# Patient Record
Sex: Female | Born: 1935 | Race: White | Hispanic: No | Marital: Single | Smoking: Former smoker
Health system: Southern US, Community
[De-identification: ages and names within clinical notes are randomized; demographics above are authoritative.]

## PROBLEM LIST (undated history)

## (undated) DIAGNOSIS — C4490 Unspecified malignant neoplasm of skin, unspecified: Secondary | ICD-10-CM

## (undated) DIAGNOSIS — C50919 Malignant neoplasm of unspecified site of unspecified female breast: Secondary | ICD-10-CM

---

## 2002-08-02 HISTORY — PX: BREAST BIOPSY: SHX20

## 2003-06-03 ENCOUNTER — Other Ambulatory Visit: Payer: Self-pay

## 2004-06-29 ENCOUNTER — Ambulatory Visit: Payer: Self-pay | Admitting: General Surgery

## 2005-07-12 ENCOUNTER — Ambulatory Visit: Payer: Self-pay | Admitting: Internal Medicine

## 2006-05-14 ENCOUNTER — Ambulatory Visit: Payer: Self-pay

## 2006-07-13 ENCOUNTER — Ambulatory Visit: Payer: Self-pay | Admitting: Internal Medicine

## 2007-02-01 ENCOUNTER — Ambulatory Visit: Payer: Self-pay | Admitting: Gastroenterology

## 2007-05-24 ENCOUNTER — Ambulatory Visit: Payer: Self-pay | Admitting: Gastroenterology

## 2007-08-03 DIAGNOSIS — C50919 Malignant neoplasm of unspecified site of unspecified female breast: Secondary | ICD-10-CM

## 2007-08-03 HISTORY — PX: BREAST BIOPSY: SHX20

## 2007-08-03 HISTORY — DX: Malignant neoplasm of unspecified site of unspecified female breast: C50.919

## 2007-08-08 ENCOUNTER — Ambulatory Visit: Payer: Self-pay | Admitting: Internal Medicine

## 2007-08-09 ENCOUNTER — Ambulatory Visit: Payer: Self-pay | Admitting: Internal Medicine

## 2007-08-31 ENCOUNTER — Ambulatory Visit: Payer: Self-pay | Admitting: General Surgery

## 2007-09-03 ENCOUNTER — Ambulatory Visit: Payer: Self-pay | Admitting: Internal Medicine

## 2007-09-07 ENCOUNTER — Ambulatory Visit: Payer: Self-pay | Admitting: General Surgery

## 2007-09-07 ENCOUNTER — Other Ambulatory Visit: Payer: Self-pay

## 2007-09-12 ENCOUNTER — Ambulatory Visit: Payer: Self-pay | Admitting: General Surgery

## 2007-09-28 ENCOUNTER — Ambulatory Visit: Payer: Self-pay | Admitting: Internal Medicine

## 2007-10-01 ENCOUNTER — Ambulatory Visit: Payer: Self-pay | Admitting: Internal Medicine

## 2007-10-31 ENCOUNTER — Ambulatory Visit: Payer: Self-pay | Admitting: Radiation Oncology

## 2007-11-01 ENCOUNTER — Ambulatory Visit: Payer: Self-pay | Admitting: Radiation Oncology

## 2007-12-01 ENCOUNTER — Ambulatory Visit: Payer: Self-pay | Admitting: Radiation Oncology

## 2008-01-01 ENCOUNTER — Ambulatory Visit: Payer: Self-pay | Admitting: Radiation Oncology

## 2008-01-31 ENCOUNTER — Ambulatory Visit: Payer: Self-pay | Admitting: Radiation Oncology

## 2008-02-15 ENCOUNTER — Ambulatory Visit: Payer: Self-pay | Admitting: Unknown Physician Specialty

## 2008-02-21 ENCOUNTER — Ambulatory Visit: Payer: Self-pay | Admitting: Unknown Physician Specialty

## 2008-03-02 ENCOUNTER — Ambulatory Visit: Payer: Self-pay | Admitting: Radiation Oncology

## 2008-03-11 ENCOUNTER — Ambulatory Visit: Payer: Self-pay | Admitting: General Surgery

## 2008-05-02 ENCOUNTER — Ambulatory Visit: Payer: Self-pay | Admitting: Internal Medicine

## 2008-05-21 ENCOUNTER — Ambulatory Visit: Payer: Self-pay | Admitting: Internal Medicine

## 2008-06-02 ENCOUNTER — Ambulatory Visit: Payer: Self-pay | Admitting: Internal Medicine

## 2008-07-02 ENCOUNTER — Ambulatory Visit: Payer: Self-pay | Admitting: Internal Medicine

## 2008-07-04 ENCOUNTER — Ambulatory Visit: Payer: Self-pay | Admitting: Internal Medicine

## 2008-08-02 ENCOUNTER — Ambulatory Visit: Payer: Self-pay | Admitting: Internal Medicine

## 2008-08-21 ENCOUNTER — Ambulatory Visit: Payer: Self-pay | Admitting: Internal Medicine

## 2008-09-02 ENCOUNTER — Ambulatory Visit: Payer: Self-pay | Admitting: Internal Medicine

## 2008-09-18 ENCOUNTER — Ambulatory Visit: Payer: Self-pay | Admitting: Internal Medicine

## 2008-09-30 ENCOUNTER — Ambulatory Visit: Payer: Self-pay | Admitting: Internal Medicine

## 2008-12-31 ENCOUNTER — Ambulatory Visit: Payer: Self-pay | Admitting: Radiation Oncology

## 2009-01-06 ENCOUNTER — Ambulatory Visit: Payer: Self-pay | Admitting: Internal Medicine

## 2009-01-30 ENCOUNTER — Ambulatory Visit: Payer: Self-pay | Admitting: Radiation Oncology

## 2009-02-18 ENCOUNTER — Ambulatory Visit: Payer: Self-pay | Admitting: Internal Medicine

## 2009-03-02 ENCOUNTER — Ambulatory Visit: Payer: Self-pay | Admitting: Internal Medicine

## 2009-03-02 ENCOUNTER — Ambulatory Visit: Payer: Self-pay | Admitting: Radiation Oncology

## 2009-04-02 ENCOUNTER — Ambulatory Visit: Payer: Self-pay | Admitting: Internal Medicine

## 2009-06-02 ENCOUNTER — Ambulatory Visit: Payer: Self-pay | Admitting: Internal Medicine

## 2009-06-20 ENCOUNTER — Ambulatory Visit: Payer: Self-pay | Admitting: Internal Medicine

## 2009-07-02 ENCOUNTER — Ambulatory Visit: Payer: Self-pay | Admitting: Internal Medicine

## 2009-08-04 ENCOUNTER — Ambulatory Visit: Payer: Self-pay | Admitting: Gastroenterology

## 2009-09-10 ENCOUNTER — Ambulatory Visit: Payer: Self-pay | Admitting: Internal Medicine

## 2009-09-30 ENCOUNTER — Ambulatory Visit: Payer: Self-pay | Admitting: Internal Medicine

## 2009-10-17 ENCOUNTER — Ambulatory Visit: Payer: Self-pay | Admitting: Internal Medicine

## 2009-10-31 ENCOUNTER — Ambulatory Visit: Payer: Self-pay | Admitting: Internal Medicine

## 2009-12-31 ENCOUNTER — Ambulatory Visit: Payer: Self-pay | Admitting: Internal Medicine

## 2010-01-15 ENCOUNTER — Ambulatory Visit: Payer: Self-pay | Admitting: Internal Medicine

## 2010-01-30 ENCOUNTER — Ambulatory Visit: Payer: Self-pay | Admitting: Internal Medicine

## 2010-02-17 ENCOUNTER — Ambulatory Visit: Payer: Self-pay | Admitting: Internal Medicine

## 2010-02-19 LAB — CANCER ANTIGEN 27.29: CA 27.29: 7.2 U/mL (ref 0.0–38.6)

## 2010-03-02 ENCOUNTER — Ambulatory Visit: Payer: Self-pay | Admitting: Internal Medicine

## 2010-08-20 ENCOUNTER — Ambulatory Visit: Payer: Self-pay | Admitting: Internal Medicine

## 2010-08-21 LAB — CANCER ANTIGEN 27.29: CA 27.29: 11.6 U/mL (ref 0.0–38.6)

## 2010-09-02 ENCOUNTER — Ambulatory Visit: Payer: Self-pay | Admitting: Internal Medicine

## 2010-10-01 ENCOUNTER — Ambulatory Visit: Payer: Self-pay | Admitting: Internal Medicine

## 2011-01-18 ENCOUNTER — Ambulatory Visit: Payer: Self-pay | Admitting: Internal Medicine

## 2011-01-31 ENCOUNTER — Ambulatory Visit: Payer: Self-pay | Admitting: Internal Medicine

## 2011-08-25 ENCOUNTER — Ambulatory Visit: Payer: Self-pay | Admitting: Internal Medicine

## 2011-08-25 LAB — CBC CANCER CENTER
Basophil %: 0.4 %
Eosinophil #: 0.3 x10 3/mm (ref 0.0–0.7)
HGB: 9.2 g/dL — ABNORMAL LOW (ref 12.0–16.0)
Lymphocyte %: 16.9 %
MCHC: 32.7 g/dL (ref 32.0–36.0)
Monocyte #: 0.9 x10 3/mm — ABNORMAL HIGH (ref 0.0–0.7)
Monocyte %: 9.7 %
Neutrophil #: 6.2 x10 3/mm (ref 1.4–6.5)
Platelet: 434 x10 3/mm (ref 150–440)
RBC: 3.21 10*6/uL — ABNORMAL LOW (ref 3.80–5.20)
RDW: 14.7 % — ABNORMAL HIGH (ref 11.5–14.5)
WBC: 8.9 x10 3/mm (ref 3.6–11.0)

## 2011-08-25 LAB — IRON AND TIBC
Iron Bind.Cap.(Total): 302 ug/dL (ref 250–450)
Iron Saturation: 23 %

## 2011-08-25 LAB — COMPREHENSIVE METABOLIC PANEL
Albumin: 2.9 g/dL — ABNORMAL LOW (ref 3.4–5.0)
Alkaline Phosphatase: 47 U/L — ABNORMAL LOW (ref 50–136)
Anion Gap: 8 (ref 7–16)
BUN: 15 mg/dL (ref 7–18)
Calcium, Total: 9.2 mg/dL (ref 8.5–10.1)
Creatinine: 0.86 mg/dL (ref 0.60–1.30)
EGFR (African American): >60
EGFR (Non-African Amer.): >60
Glucose: 116 mg/dL — ABNORMAL HIGH (ref 65–99)
Osmolality: 285 (ref 275–301)
SGOT(AST): 12 U/L — ABNORMAL LOW (ref 15–37)
SGPT (ALT): 9 U/L — ABNORMAL LOW
Total Protein: 7.1 g/dL (ref 6.4–8.2)

## 2011-09-03 ENCOUNTER — Ambulatory Visit: Payer: Self-pay | Admitting: Internal Medicine

## 2011-09-29 ENCOUNTER — Ambulatory Visit: Payer: Self-pay | Admitting: Gastroenterology

## 2011-10-01 LAB — PATHOLOGY REPORT

## 2011-10-19 ENCOUNTER — Ambulatory Visit: Payer: Self-pay

## 2011-11-10 ENCOUNTER — Ambulatory Visit: Payer: Self-pay | Admitting: Internal Medicine

## 2012-01-17 ENCOUNTER — Ambulatory Visit: Payer: Self-pay | Admitting: Radiation Oncology

## 2012-01-31 ENCOUNTER — Ambulatory Visit: Payer: Self-pay | Admitting: Radiation Oncology

## 2012-02-23 LAB — COMPREHENSIVE METABOLIC PANEL
Alkaline Phosphatase: 56 U/L (ref 50–136)
Anion Gap: 9 (ref 7–16)
BUN: 18 mg/dL (ref 7–18)
Chloride: 104 mmol/L (ref 98–107)
Creatinine: 0.9 mg/dL (ref 0.60–1.30)
EGFR (African American): >60
Glucose: 97 mg/dL (ref 65–99)
Potassium: 4.2 mmol/L (ref 3.5–5.1)
SGOT(AST): 12 U/L — ABNORMAL LOW (ref 15–37)
Sodium: 141 mmol/L (ref 136–145)
Total Protein: 7.3 g/dL (ref 6.4–8.2)

## 2012-02-23 LAB — CBC CANCER CENTER
Basophil #: 0 x10 3/mm (ref 0.0–0.1)
Basophil %: 0.4 %
Eosinophil %: 2.8 %
HCT: 36.5 % (ref 35.0–47.0)
HGB: 11.9 g/dL — ABNORMAL LOW (ref 12.0–16.0)
Lymphocyte #: 1.7 x10 3/mm (ref 1.0–3.6)
Lymphocyte %: 20.8 %
MCH: 31 pg (ref 26.0–34.0)
MCHC: 32.7 g/dL (ref 32.0–36.0)
MCV: 95 fL (ref 80–100)
Monocyte %: 10.3 %
Neutrophil #: 5.4 x10 3/mm (ref 1.4–6.5)

## 2012-02-24 LAB — CANCER ANTIGEN 27.29: CA 27.29: 8.2 U/mL (ref 0.0–38.6)

## 2012-03-02 ENCOUNTER — Ambulatory Visit: Payer: Self-pay | Admitting: Oncology

## 2012-03-02 ENCOUNTER — Ambulatory Visit: Payer: Self-pay | Admitting: Radiation Oncology

## 2012-08-02 ENCOUNTER — Ambulatory Visit: Payer: Self-pay | Admitting: Oncology

## 2012-09-30 ENCOUNTER — Ambulatory Visit: Payer: Self-pay | Admitting: Oncology

## 2012-10-19 ENCOUNTER — Ambulatory Visit: Payer: Self-pay | Admitting: Oncology

## 2012-10-31 ENCOUNTER — Ambulatory Visit: Payer: Self-pay | Admitting: Oncology

## 2012-10-31 LAB — CBC CANCER CENTER
Basophil #: 0 x10 3/mm (ref 0.0–0.1)
Eosinophil #: 0.4 x10 3/mm (ref 0.0–0.7)
Eosinophil %: 5.3 %
HCT: 36.6 % (ref 35.0–47.0)
Lymphocyte #: 1.4 x10 3/mm (ref 1.0–3.6)
Lymphocyte %: 20.3 %
MCH: 31.8 pg (ref 26.0–34.0)
MCV: 93 fL (ref 80–100)
Monocyte #: 0.8 x10 3/mm (ref 0.2–0.9)
Monocyte %: 10.8 %
Neutrophil #: 4.4 x10 3/mm (ref 1.4–6.5)
Neutrophil %: 62.9 %

## 2012-10-31 LAB — COMPREHENSIVE METABOLIC PANEL
Anion Gap: 11 (ref 7–16)
BUN: 16 mg/dL (ref 7–18)
Bilirubin,Total: 0.2 mg/dL (ref 0.2–1.0)
EGFR (African American): 55 — ABNORMAL LOW
Glucose: 141 mg/dL — ABNORMAL HIGH (ref 65–99)
Osmolality: 281 (ref 275–301)
SGOT(AST): 16 U/L (ref 15–37)
Sodium: 139 mmol/L (ref 136–145)
Total Protein: 7.9 g/dL (ref 6.4–8.2)

## 2012-11-01 LAB — CANCER ANTIGEN 27.29: CA 27.29: 12.8 U/mL (ref 0.0–38.6)

## 2012-11-30 ENCOUNTER — Ambulatory Visit: Payer: Self-pay | Admitting: Oncology

## 2013-06-23 IMAGING — CT CT HEAD WITHOUT CONTRAST
2 series · 16 of 30 positions shown, 20 images · non-contrast
Comparison: none

REASON FOR EXAM: dizziness
COMMENTS:

PROCEDURE:     CT  - CT HEAD WITHOUT CONTRAST  - November 10, 2011  [DATE]
RESULT:     Comparison:  None
TECHNIQUE: Multiple axial images from the foramen magnum to the vertex were
obtained without IV contrast.

[Series 2: without · axial · non-contrast · 0.43mm/px · z∈[+486,+606]mm · 13 of 29 slices shown, 17 images]
[im 3/29  brain]
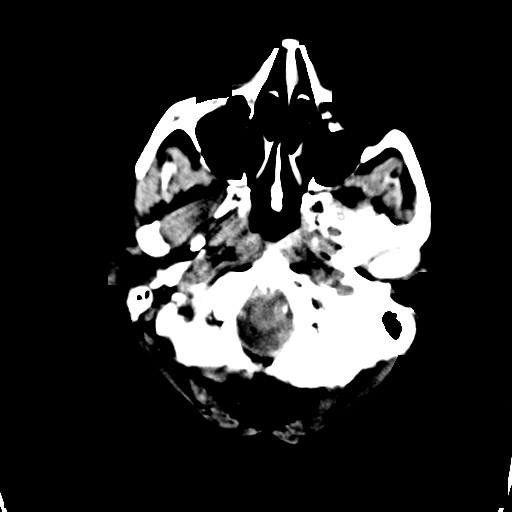
[im 3/29  bone]
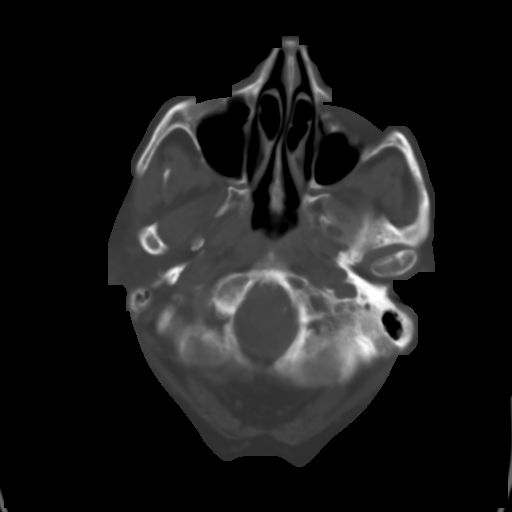
[im 5/29  brain]
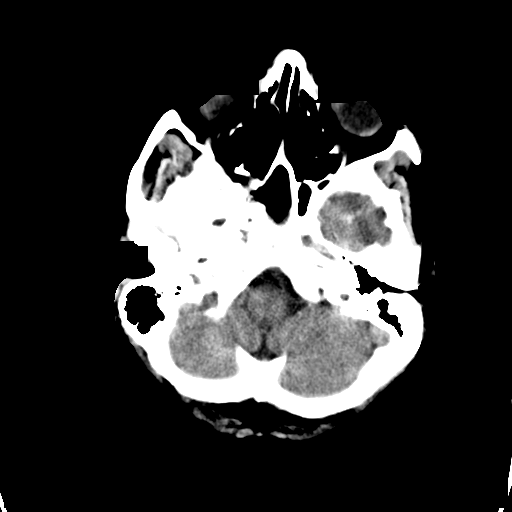
[im 7/29  brain]
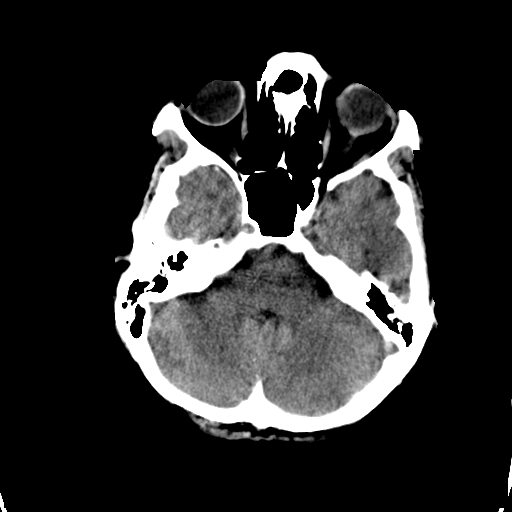
[im 9/29  brain]
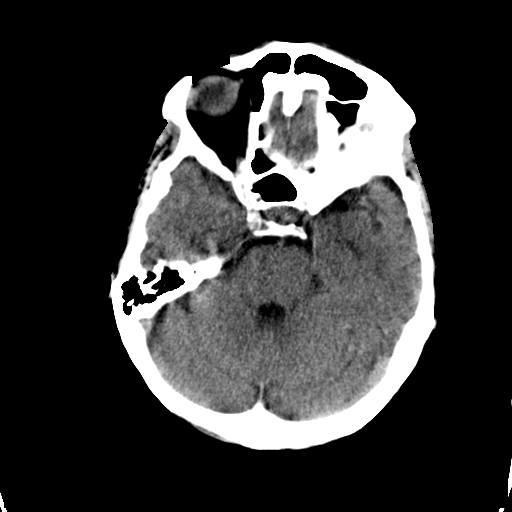
[im 11/29  brain]
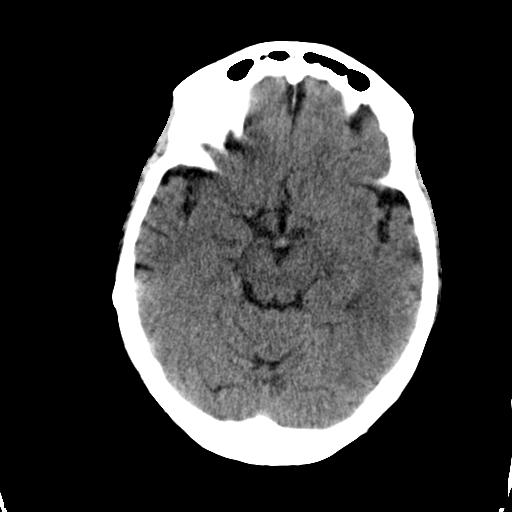
[im 11/29  bone]
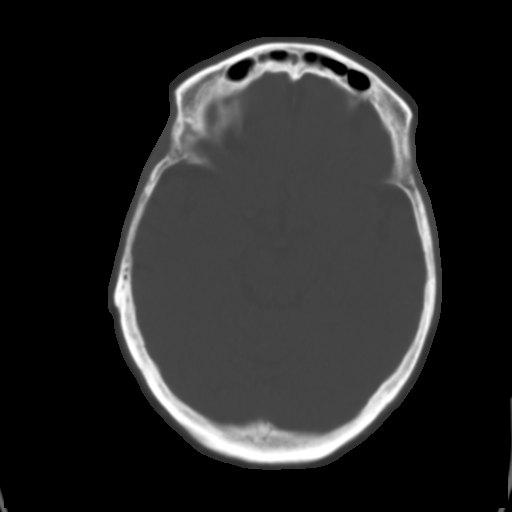
[im 13/29  brain]
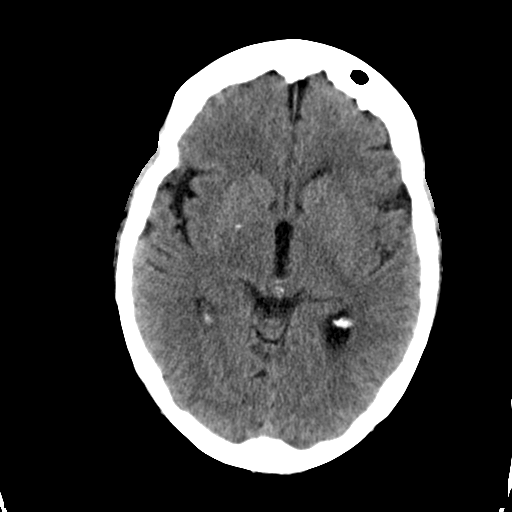
[im 15/29  brain]
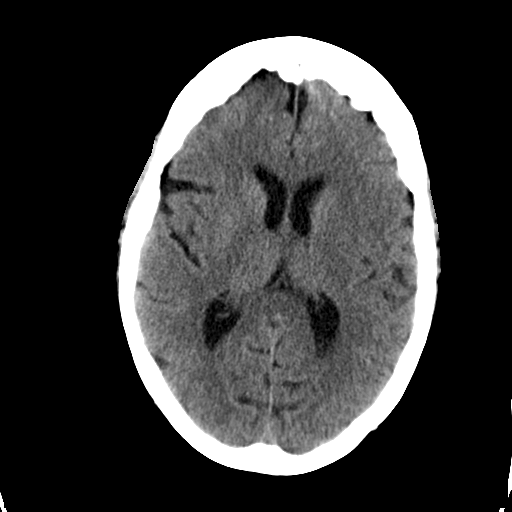
[im 17/29  brain]
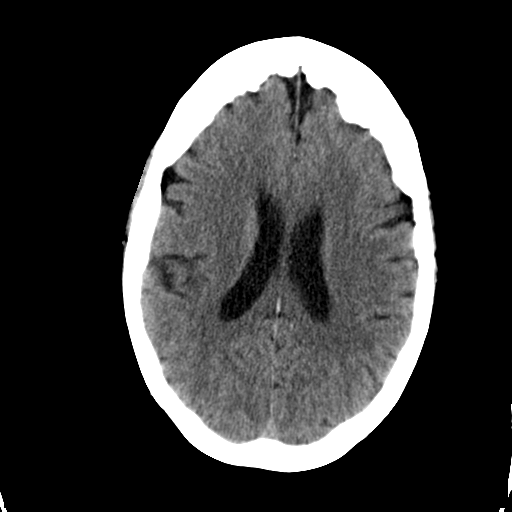
[im 19/29  brain]
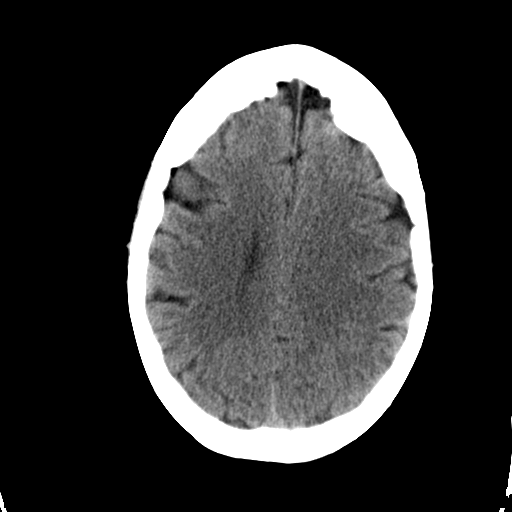
[im 19/29  bone]
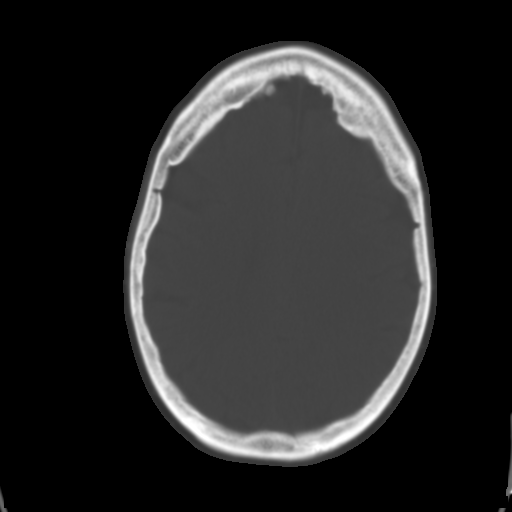
[im 21/29  brain]
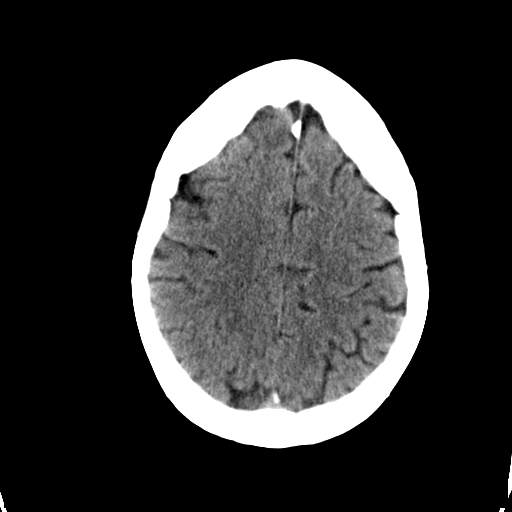
[im 23/29  brain]
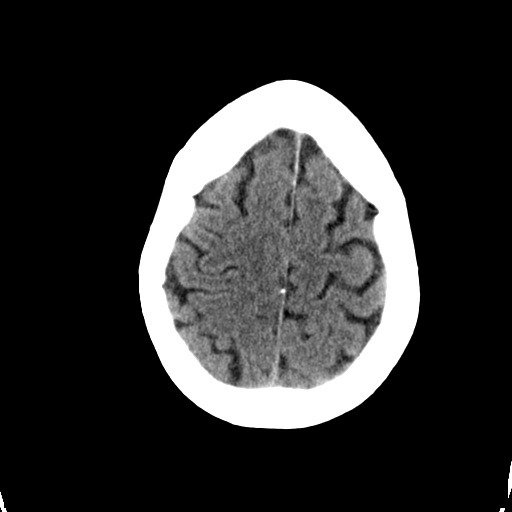
[im 25/29  brain]
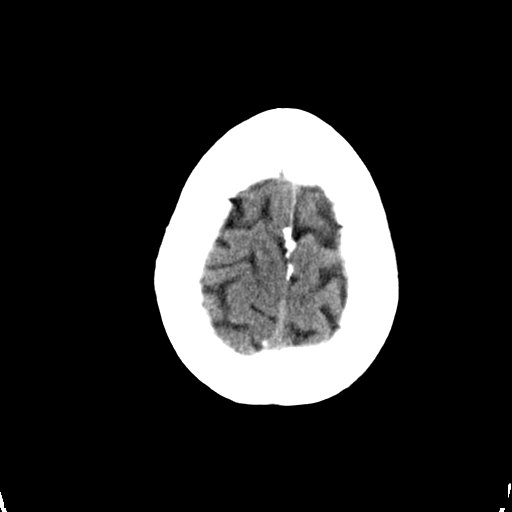
[im 27/29  brain]
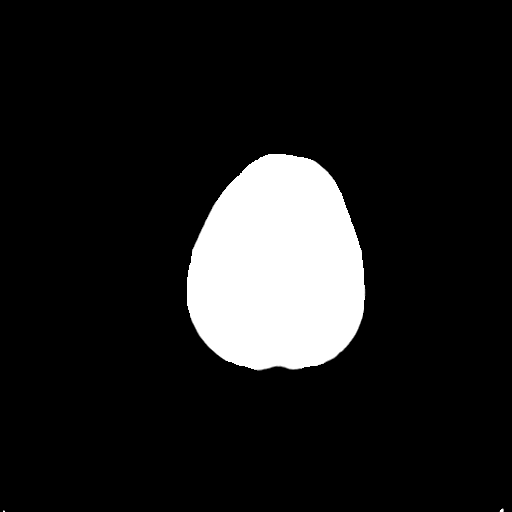
[im 27/29  bone]
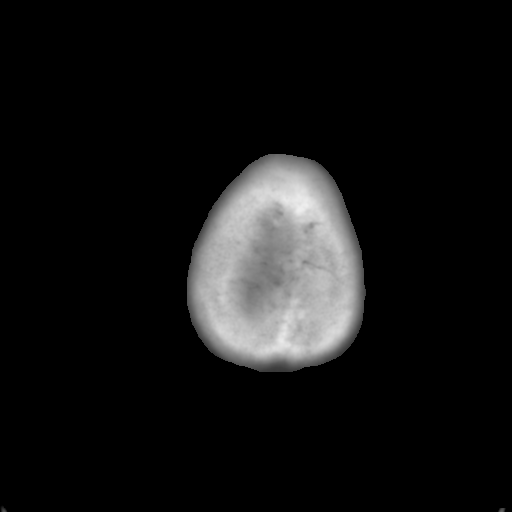

[Series 3: bone · axial · 0.43mm/px · z∈[+486,+526]mm · 3 of 29 slices shown]
[im 3/29  bone]
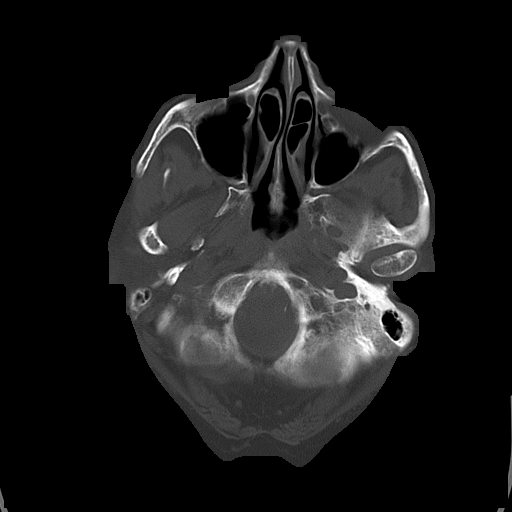
[im 7/29  bone]
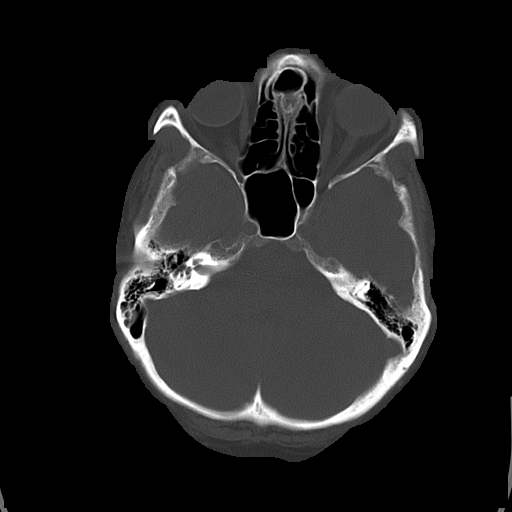
[im 11/29  bone]
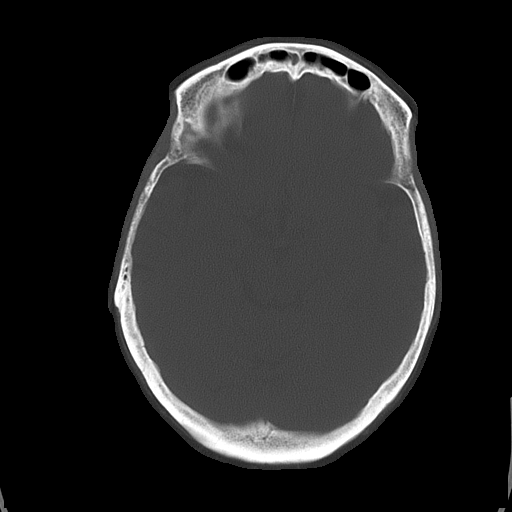

[16 of 30 positions shown; findings below may reference images not displayed]

FINDINGS: There is no evidence for mass effect, midline shift, or extra-axial fluid
collections. There is no evidence for space-occupying lesion, intracranial
hemorrhage, or cortical-based area of infarction. Minimal periventricular
hypoattenuation is likely sequela of chronic microangiopathy.

The osseous structures are unremarkable.
IMPRESSION: No acute intracranial process.

If there is continued clinical concern, further evaluation could be provided
with MRI.

## 2013-12-11 ENCOUNTER — Ambulatory Visit: Payer: Self-pay | Admitting: Internal Medicine

## 2014-12-25 ENCOUNTER — Other Ambulatory Visit: Payer: Self-pay | Admitting: Internal Medicine

## 2014-12-25 DIAGNOSIS — Z853 Personal history of malignant neoplasm of breast: Secondary | ICD-10-CM

## 2015-01-09 ENCOUNTER — Ambulatory Visit: Admission: RE | Admit: 2015-01-09 | Payer: Medicare Other | Source: Ambulatory Visit

## 2015-01-09 ENCOUNTER — Ambulatory Visit
Admission: RE | Admit: 2015-01-09 | Discharge: 2015-01-09 | Disposition: A | Discharge status: Home / Self Care | Payer: Medicare Other | Source: Ambulatory Visit | Attending: Internal Medicine | Admitting: Internal Medicine

## 2015-01-09 DIAGNOSIS — Z9889 Other specified postprocedural states: Secondary | ICD-10-CM | POA: Diagnosis not present

## 2015-01-09 DIAGNOSIS — Z853 Personal history of malignant neoplasm of breast: Secondary | ICD-10-CM | POA: Insufficient documentation

## 2015-01-09 HISTORY — DX: Unspecified malignant neoplasm of skin, unspecified: C44.90

## 2015-01-09 HISTORY — DX: Malignant neoplasm of unspecified site of unspecified female breast: C50.919

## 2015-09-08 ENCOUNTER — Other Ambulatory Visit: Payer: Self-pay | Admitting: Internal Medicine

## 2015-09-08 DIAGNOSIS — Z1231 Encounter for screening mammogram for malignant neoplasm of breast: Secondary | ICD-10-CM

## 2015-09-08 DIAGNOSIS — M48062 Spinal stenosis, lumbar region with neurogenic claudication: Secondary | ICD-10-CM | POA: Insufficient documentation

## 2016-01-13 ENCOUNTER — Ambulatory Visit: Payer: Medicare Other

## 2016-01-16 ENCOUNTER — Ambulatory Visit
Admission: RE | Admit: 2016-01-16 | Discharge: 2016-01-16 | Disposition: A | Discharge status: Home / Self Care | Payer: Medicare Other | Source: Ambulatory Visit | Attending: Internal Medicine | Admitting: Internal Medicine

## 2016-01-16 ENCOUNTER — Other Ambulatory Visit: Payer: Self-pay | Admitting: Internal Medicine

## 2016-01-16 DIAGNOSIS — Z1231 Encounter for screening mammogram for malignant neoplasm of breast: Secondary | ICD-10-CM | POA: Insufficient documentation

## 2016-01-16 DIAGNOSIS — R928 Other abnormal and inconclusive findings on diagnostic imaging of breast: Secondary | ICD-10-CM | POA: Insufficient documentation

## 2016-01-20 ENCOUNTER — Other Ambulatory Visit: Payer: Self-pay | Admitting: Internal Medicine

## 2016-01-20 DIAGNOSIS — N6459 Other signs and symptoms in breast: Secondary | ICD-10-CM

## 2016-01-20 DIAGNOSIS — N6489 Other specified disorders of breast: Secondary | ICD-10-CM

## 2016-01-29 ENCOUNTER — Ambulatory Visit
Admission: RE | Admit: 2016-01-29 | Discharge: 2016-01-29 | Disposition: A | Discharge status: Home / Self Care | Payer: Medicare Other | Source: Ambulatory Visit | Attending: Internal Medicine | Admitting: Internal Medicine

## 2016-01-29 DIAGNOSIS — N6489 Other specified disorders of breast: Secondary | ICD-10-CM

## 2016-01-29 DIAGNOSIS — N6459 Other signs and symptoms in breast: Secondary | ICD-10-CM

## 2018-09-08 DIAGNOSIS — M810 Age-related osteoporosis without current pathological fracture: Secondary | ICD-10-CM | POA: Insufficient documentation

## 2018-09-08 DIAGNOSIS — E118 Type 2 diabetes mellitus with unspecified complications: Secondary | ICD-10-CM | POA: Insufficient documentation

## 2019-09-27 ENCOUNTER — Ambulatory Visit: Payer: Medicare Other | Attending: Internal Medicine

## 2019-09-27 DIAGNOSIS — Z23 Encounter for immunization: Secondary | ICD-10-CM | POA: Insufficient documentation

## 2019-09-27 NOTE — Progress Notes (Signed)
   Covid-19 Vaccination Clinic  Name:  Breanna Mendoza    MRN: [id number] DOB: 21-Apr-1931  09/27/2019  Breanna Mendoza was observed post Covid-19 immunization for 30 minutes based on pre-vaccination screening without incidence. She was provided with Vaccine Information Sheet and instruction to access the V-Safe system.   Breanna Mendoza was instructed to call 911 with any severe reactions post vaccine: Marland Kitchen Difficulty breathing  . Swelling of your face and throat  . A fast heartbeat  . A bad rash all over your body  . Dizziness and weakness    Immunizations Administered    Name Date Dose VIS Date Route   Pfizer COVID-19 Vaccine 09/27/2019 10:36 AM 0.3 mL 07/13/2019 Intramuscular   Manufacturer: [place]   Lot: [id number]   [place]: [id number]

## 2019-10-23 ENCOUNTER — Ambulatory Visit: Payer: Medicare Other | Attending: Internal Medicine

## 2019-10-23 DIAGNOSIS — Z23 Encounter for immunization: Secondary | ICD-10-CM

## 2019-10-23 NOTE — Progress Notes (Signed)
   Covid-19 Vaccination Clinic  Name:  Breanna Mendoza    MRN: [id number] DOB: 01-Aug-1931  10/23/2019  Breanna Mendoza was observed post Covid-19 immunization for 15 minutes without incident. She was provided with Vaccine Information Sheet and instruction to access the V-Safe system.   Breanna Mendoza was instructed to call 911 with any severe reactions post vaccine: Marland Kitchen Difficulty breathing  . Swelling of face and throat  . A fast heartbeat  . A bad rash all over body  . Dizziness and weakness   Immunizations Administered    Name Date Dose VIS Date Route   Pfizer COVID-19 Vaccine 10/23/2019 10:58 AM 0.3 mL 07/13/2019 Intramuscular   Manufacturer: [place]   Lot: [id number]   [place]: [id number]

## 2022-03-30 ENCOUNTER — Ambulatory Visit (INDEPENDENT_AMBULATORY_CARE_PROVIDER_SITE_OTHER): Payer: Medicare Other | Admitting: Vascular Surgery

## 2022-03-30 ENCOUNTER — Encounter (INDEPENDENT_AMBULATORY_CARE_PROVIDER_SITE_OTHER): Payer: Self-pay | Admitting: Vascular Surgery

## 2022-03-30 VITALS — BP 186/75 | HR 84 | Resp 16 | Wt 133.4 lb

## 2022-03-30 DIAGNOSIS — E039 Hypothyroidism, unspecified: Secondary | ICD-10-CM | POA: Insufficient documentation

## 2022-03-30 DIAGNOSIS — M542 Cervicalgia: Secondary | ICD-10-CM | POA: Insufficient documentation

## 2022-03-30 DIAGNOSIS — E118 Type 2 diabetes mellitus with unspecified complications: Secondary | ICD-10-CM

## 2022-03-30 DIAGNOSIS — I739 Peripheral vascular disease, unspecified: Secondary | ICD-10-CM | POA: Diagnosis not present

## 2022-03-30 DIAGNOSIS — N6019 Diffuse cystic mastopathy of unspecified breast: Secondary | ICD-10-CM | POA: Insufficient documentation

## 2022-03-30 NOTE — Assessment & Plan Note (Signed)
The patient had peripheral arterial disease noted on an outpatient home health screening.  We will perform a more detailed noninvasive study in the near future at her convenience.  We discussed the pathophysiology and natural history of peripheral arterial disease.  Her symptoms at this time are minimal if any and she is unlikely require intervention given her minimal symptoms and advanced age.  I would recommend a more detailed noninvasive test to evaluate her baseline and then we can determine follow-up from there.  She is on aspirin therapy and should continue this.

## 2022-03-30 NOTE — Patient Instructions (Signed)
Peripheral Vascular Disease  Peripheral vascular disease (PVD) is a disease of the blood vessels that carry blood from the heart to the rest of the body. PVD is also called peripheral artery disease (PAD) or poor circulation. PVD affects most of the body. But it affects the legs and feet the most. PVD can lead to acute limb ischemia. This happens when there is a sudden stop of blood flow to an arm or leg. This is a medical emergency. What are the causes? The most common cause of PVD is a buildup of a fatty substance (plaque) inside your arteries. This decreases blood flow. Plaque can break off and block blood in a smaller artery. This can lead to acute limb ischemia. Other common causes of PVD include: Blood clots inside the blood vessels. Injuries to blood vessels. Irritation and swelling of blood vessels. Sudden tightening of the blood vessel (spasms). What increases the risk? A family history of PVD. Medical conditions, including: High cholesterol. Diabetes. High blood pressure. Heart disease. Past problems with blood clots. Past injury, such as burns or a broken bone. Other conditions, such as: Buerger's disease. This is caused by swollen or irritated blood vessels in your hands and feet. Arthritis. Birth defects that affect the arteries in your legs. Kidney disease. Using tobacco or nicotine products. Not getting enough exercise. Being very overweight (obese). Being 50 years old or older. What are the signs or symptoms? Cramps in your butt, legs, and feet. Pain and weakness in your legs when you are active that goes away when you rest. Leg pain when at rest. Leg numbness, tingling, or weakness. Coldness in a leg or foot, especially when compared with the other leg or foot. Skin or hair changes. These can include: Hair loss. Shiny skin. Pale or bluish skin. Thick toenails. Being unable to get or keep an erection. Tiredness (fatigue). Weak pulse or no pulse in the  feet. Wounds and sores on the toes, feet, or legs. These take longer to heal. How is this treated? Underlying causes are treated first. Other conditions, like diabetes, high cholesterol, and blood pressure, are also treated. Treatment may include: Lifestyle changes, such as: Quitting smoking. Getting regular exercise. Having a diet low in fat and cholesterol. Not drinking alcohol. Taking medicines, such as: Blood thinners. Medicines to improve blood flow. Medicines to improve your blood cholesterol. Procedures to: Open the arteries and restore blood flow. Insert a small mesh tube (stent) to keep a blocked vessel open. Create a new path for blood to flow to the body (peripheral bypass). Remove dead tissue from a wound. Remove an affected leg or arm. Follow these instructions at home: Medicines Take over-the-counter and prescription medicines only as told by your doctor. If you are taking blood thinners: Talk with your doctor before you take any medicines that have aspirin, or NSAIDs, such as ibuprofen. Take medicines exactly as told. Take them at the same time each day. Avoid doing things that could hurt or bruise you. Take action to prevent falls. Wear an alert bracelet or carry a card that shows you are taking blood thinners. Lifestyle     Get regular exercise. Ask your doctor about how to stay active. Talk with your doctor about keeping a healthy weight. If needed, ask about losing weight. Eat a diet that is low in fat and cholesterol. If you need help, talk with your doctor. Do not drink alcohol. Do not smoke or use any products that contain nicotine or tobacco. If you need help   quitting, ask your doctor. General instructions Take good care of your feet. To do this: Wear shoes that fit well and feel good. Check your feet often for any cuts or sores. Get a flu shot (influenza vaccine) each year. Keep all follow-up visits. Where to find more information Society for  Vascular Surgery: vascular.org American Heart Association: heart.org National Heart, Lung, and Blood Institute: nhlbi.nih.gov Contact a doctor if: You have cramps in your legs when you walk. You have leg pain when you rest. Your leg or foot feels cold. Your skin changes. You cannot get or keep an erection. You have cuts or sores on your legs or feet that do not heal. Get help right away if: You have sudden changes in the color and feeling of your arms or legs, such as: Your arm or leg turns cold, numb, and blue. Your arm or leg becomes red, warm, swollen, painful, or numb. You have any signs of a stroke. "BE FAST" is an easy way to remember the main warning signs: B - Balance. Dizziness, sudden trouble walking, or loss of balance. E - Eyes. Trouble seeing or a change in how you see. F - Face. Sudden weakness or loss of feeling of the face. The face or eyelid may droop on one side. A - Arms. Weakness or loss of feeling in an arm. This happens all of a sudden and most often on one side of the body. S - Speech. Sudden trouble speaking, slurred speech, or trouble understanding what people say. T - Time. Time to call emergency services. Write down what time symptoms started. You have other signs of a stroke, such as: A sudden, very bad headache with no known cause. Feeling like you may vomit (nausea). Vomiting. A seizure. You have chest pain or trouble breathing. These symptoms may be an emergency. Get help right away. Call your local emergency services (911 in the [place] Do not wait to see if the symptoms will go away. Do not drive yourself to the hospital. Summary Peripheral vascular disease (PVD) is a disease of the blood vessels. PVD affects the legs and feet the most. Symptoms may include leg pain or leg numbness, tingling, and weakness. Treatment may include lifestyle changes, medicines, and procedures. This information is not intended to replace advice given to you by your health  care provider. Make sure you discuss any questions you have with your health care provider. Document Revised: 01/21/2020 Document Reviewed: 01/21/2020 Elsevier Patient Education  2023 Elsevier Inc.  

## 2022-03-30 NOTE — Assessment & Plan Note (Signed)
blood glucose control important in reducing the progression of atherosclerotic disease. Also, involved in wound healing. On appropriate medications.  

## 2022-03-30 NOTE — Progress Notes (Signed)
Patient ID: Breanna Mendoza, female   DOB: 09/10/1930, 88 y.o.   MRN: [id number]  Chief Complaint  Patient presents with   New Patient (Initial Visit)    Ref [place] consult PAD    HPI Breanna Mendoza is a 88 y.o. female.  I am asked to see the patient by Dr. Doy Hutching for evaluation of PAD seen on a home health care screening.  The patient says she is not currently having any symptoms of rest pain, disabling claudication, or ulceration or infection of the lower extremities.  She reports no significant pain and she still ambulates independently without a cane or walker.  She denies any nonhealing wounds.  She has had no tissue loss of the feet or lower legs.  She reports no known issues prior to this screening test.  She gets some occasional swelling but its not that bothersome.  She has never had a lower extremity revascularization or surgery to her knowledge.  She has been treated for breast cancer previously   Past Medical History:  Diagnosis Date   Breast cancer ([place]) 2009   left breast with rad tx.    Skin cancer     Past Surgical History:  Procedure Laterality Date   BREAST BIOPSY Left 2009   positve. Lumpectomy with rad tx.   BREAST BIOPSY Right 2004   negative     Family History  Problem Relation Age of Onset   Breast cancer Maternal Aunt   No bleeding or clotting disorders No aneurysms   Social History   Tobacco Use   Smoking status: Former    Types: Cigarettes   Smokeless tobacco: Never  Vaping Use   Vaping Use: Never used  Substance Use Topics   Alcohol use: Never   Drug use: Never     Allergies  Allergen Reactions   Penicillin G Rash    Current Outpatient Medications  Medication Sig Dispense Refill   acetaminophen (TYLENOL) 325 MG tablet Take 325 mg by mouth every 4 (four) hours as needed.     Ascorbic Acid (VITAMIN C) 1000 MG tablet Take by mouth.     aspirin EC 81 MG tablet Take by mouth.     Calcium Carb-Cholecalciferol (CALCIUM CARBONATE-VITAMIN  D3 PO) Take by mouth.     ferrous sulfate 325 (65 FE) MG tablet Take 325 mg by mouth daily with breakfast.     levocetirizine (XYZAL) 5 MG tablet SMARTSIG:1 Tablet(s) By Mouth Every Evening     levothyroxine (SYNTHROID) 75 MCG tablet Take 75 mcg by mouth daily.     metFORMIN (GLUCOPHAGE) 500 MG tablet Take by mouth.     Multiple Vitamins-Minerals (PRESERVISION AREDS PO) Take 2 capsules by mouth daily.     omeprazole (PRILOSEC) 20 MG capsule Take by mouth.     predniSONE (DELTASONE) 10 MG tablet Take by mouth.     propranolol (INDERAL) 10 MG tablet Take 10 mg by mouth 2 (two) times daily.     No current facility-administered medications for this visit.      REVIEW OF SYSTEMS (Negative unless checked)  Constitutional: '[]'$ Weight loss  '[]'$ Fever  '[]'$ Chills Cardiac: '[]'$ Chest pain   '[]'$ Chest pressure   '[]'$ Palpitations   '[]'$ Shortness of breath when laying flat   '[]'$ Shortness of breath at rest   '[]'$ Shortness of breath with exertion. Vascular:  '[]'$ Pain in legs with walking   '[]'$ Pain in legs at rest   '[]'$ Pain in legs when laying flat   '[]'$ Claudication   '[]'$ Pain in feet  when walking  '[]'$ Pain in feet at rest  '[]'$ Pain in feet when laying flat   '[]'$ History of DVT   '[]'$ Phlebitis   '[]'$ Swelling in legs   '[]'$ Varicose veins   '[]'$ Non-healing ulcers Pulmonary:   '[]'$ Uses home oxygen   '[]'$ Productive cough   '[]'$ Hemoptysis   '[]'$ Wheeze  '[]'$ COPD   '[]'$ Asthma Neurologic:  '[]'$ Dizziness  '[]'$ Blackouts   '[]'$ Seizures   '[]'$ History of stroke   '[]'$ History of TIA  '[]'$ Aphasia   '[]'$ Temporary blindness   '[]'$ Dysphagia   '[]'$ Weakness or numbness in arms   '[]'$ Weakness or numbness in legs Musculoskeletal:  '[x]'$ Arthritis   '[]'$ Joint swelling   '[]'$ Joint pain   '[]'$ Low back pain Hematologic:  '[]'$ Easy bruising  '[]'$ Easy bleeding   '[]'$ Hypercoagulable state   '[]'$ Anemic  '[]'$ Hepatitis Gastrointestinal:  '[]'$ Blood in stool   '[]'$ Vomiting blood  '[]'$ Gastroesophageal reflux/heartburn   '[]'$ Abdominal pain Genitourinary:  '[]'$ Chronic kidney disease   '[]'$ Difficult urination  '[]'$ Frequent urination  '[]'$ Burning  with urination   '[]'$ Hematuria Skin:  '[]'$ Rashes   '[]'$ Ulcers   '[]'$ Wounds Psychological:  '[]'$ History of anxiety   '[]'$  History of major depression.    Physical Exam BP (!) 186/75 (BP Location: Right Arm)   Pulse 84   Resp 16   Wt 133 lb 6.4 oz (60.5 kg)  Gen:  WD/WN, NAD. Appears younger than stated age. Head: [place]/AT, No temporalis wasting. Ear/Nose/Throat: Hearing grossly intact, nares w/o erythema or drainage, oropharynx w/o Erythema/Exudate Eyes: Conjunctiva clear, sclera non-icteric  Neck: trachea midline.  No JVD.  Pulmonary:  Good air movement, respirations not labored, no use of accessory muscles  Cardiac: RRR, no JVD Vascular:  Vessel Right Left  Radial Palpable Palpable                          DP 2+ 1+  PT trace trace   Gastrointestinal:. No masses, surgical incisions, or scars. Musculoskeletal: M/S 5/5 throughout.  Extremities without ischemic changes.  No deformity or atrophy. Trace LE edema. Scattered varicosities present.  Neurologic: Sensation grossly intact in extremities.  Symmetrical.  Speech is fluent. Motor exam as listed above. Psychiatric: Judgment intact, Mood & affect appropriate for pt's clinical situation. Dermatologic: No rashes or ulcers noted.  No cellulitis or open wounds.    Radiology No results found.  Labs No results found for this or any previous visit (from the past 2160 hour(s)).  Assessment/Plan:  PAD (peripheral artery disease) ([place]) The patient had peripheral arterial disease noted on an outpatient home health screening.  We will perform a more detailed noninvasive study in the near future at her convenience.  We discussed the pathophysiology and natural history of peripheral arterial disease.  Her symptoms at this time are minimal if any and she is unlikely require intervention given her minimal symptoms and advanced age.  I would recommend a more detailed noninvasive test to evaluate her baseline and then we can determine follow-up from  there.  She is on aspirin therapy and should continue this.  Type II diabetes mellitus with manifestations (HCC) blood glucose control important in reducing the progression of atherosclerotic disease. Also, involved in wound healing. On appropriate medications.      Leotis Pain 03/30/2022, 3:24 PM   This note was created with Dragon medical transcription system.  Any errors from dictation are unintentional.

## 2022-05-18 ENCOUNTER — Ambulatory Visit (INDEPENDENT_AMBULATORY_CARE_PROVIDER_SITE_OTHER): Payer: Medicare Other

## 2022-05-18 ENCOUNTER — Ambulatory Visit (INDEPENDENT_AMBULATORY_CARE_PROVIDER_SITE_OTHER): Payer: Medicare Other | Admitting: Vascular Surgery

## 2022-05-18 DIAGNOSIS — I739 Peripheral vascular disease, unspecified: Secondary | ICD-10-CM | POA: Diagnosis not present

## 2022-06-04 ENCOUNTER — Ambulatory Visit (INDEPENDENT_AMBULATORY_CARE_PROVIDER_SITE_OTHER): Payer: Medicare Other | Admitting: Vascular Surgery

## 2022-06-29 ENCOUNTER — Encounter (INDEPENDENT_AMBULATORY_CARE_PROVIDER_SITE_OTHER): Payer: Self-pay | Admitting: Vascular Surgery

## 2022-06-29 ENCOUNTER — Ambulatory Visit (INDEPENDENT_AMBULATORY_CARE_PROVIDER_SITE_OTHER): Payer: Medicare Other | Admitting: Vascular Surgery

## 2022-06-29 VITALS — BP 160/72 | HR 73 | Resp 16 | Wt 138.6 lb

## 2022-06-29 DIAGNOSIS — E118 Type 2 diabetes mellitus with unspecified complications: Secondary | ICD-10-CM

## 2022-06-29 DIAGNOSIS — I739 Peripheral vascular disease, unspecified: Secondary | ICD-10-CM

## 2022-06-29 NOTE — Progress Notes (Signed)
MRN : [id number]  Breanna Mendoza is a 88 y.o. (20-Feb-1931) female who presents with chief complaint of  Chief Complaint  Patient presents with   Follow-up    Ultrasound results  .  History of Present Illness: Patient returns today in follow up of her PAD.  This was found on a home health screening test several months ago.  We performed ABIs last month which showed a right ABI of 1.0 and a left ABI of 0.7.  She denies any disabling claudication, rest pain, or ulceration.  She has no new complaints today.  Current Outpatient Medications  Medication Sig Dispense Refill   acetaminophen (TYLENOL) 325 MG tablet Take 325 mg by mouth every 4 (four) hours as needed.     Ascorbic Acid (VITAMIN C) 1000 MG tablet Take by mouth.     aspirin EC 81 MG tablet Take by mouth.     Calcium Carb-Cholecalciferol (CALCIUM CARBONATE-VITAMIN D3 PO) Take by mouth.     ferrous sulfate 325 (65 FE) MG tablet Take 325 mg by mouth daily with breakfast.     levocetirizine (XYZAL) 5 MG tablet SMARTSIG:1 Tablet(s) By Mouth Every Evening     levothyroxine (SYNTHROID) 75 MCG tablet Take 75 mcg by mouth daily.     metFORMIN (GLUCOPHAGE) 500 MG tablet Take by mouth.     Multiple Vitamins-Minerals (PRESERVISION AREDS PO) Take 2 capsules by mouth daily.     omeprazole (PRILOSEC) 20 MG capsule Take by mouth.     predniSONE (DELTASONE) 10 MG tablet Take by mouth.     propranolol (INDERAL) 10 MG tablet Take 10 mg by mouth 2 (two) times daily.     No current facility-administered medications for this visit.    Past Medical History:  Diagnosis Date   Breast cancer ([place]) 2009   left breast with rad tx.    Skin cancer     Past Surgical History:  Procedure Laterality Date   BREAST BIOPSY Left 2009   positve. Lumpectomy with rad tx.   BREAST BIOPSY Right 2004   negative     Social History   Tobacco Use   Smoking status: Former    Types: Cigarettes   Smokeless tobacco: Never  Vaping Use   Vaping Use: Never used   Substance Use Topics   Alcohol use: Never   Drug use: Never      Family History  Problem Relation Age of Onset   Breast cancer Maternal Aunt      Allergies  Allergen Reactions   Penicillin G Rash     REVIEW OF SYSTEMS (Negative unless checked)  Constitutional: '[]'$ Weight loss  '[]'$ Fever  '[]'$ Chills Cardiac: '[]'$ Chest pain   '[]'$ Chest pressure   '[]'$ Palpitations   '[]'$ Shortness of breath when laying flat   '[]'$ Shortness of breath at rest   '[]'$ Shortness of breath with exertion. Vascular:  '[]'$ Pain in legs with walking   '[]'$ Pain in legs at rest   '[]'$ Pain in legs when laying flat   '[]'$ Claudication   '[]'$ Pain in feet when walking  '[]'$ Pain in feet at rest  '[]'$ Pain in feet when laying flat   '[]'$ History of DVT   '[]'$ Phlebitis   '[]'$ Swelling in legs   '[]'$ Varicose veins   '[]'$ Non-healing ulcers Pulmonary:   '[]'$ Uses home oxygen   '[]'$ Productive cough   '[]'$ Hemoptysis   '[]'$ Wheeze  '[]'$ COPD   '[]'$ Asthma Neurologic:  '[]'$ Dizziness  '[]'$ Blackouts   '[]'$ Seizures   '[]'$ History of stroke   '[]'$ History of TIA  '[]'$ Aphasia   '[]'$ Temporary blindness   '[]'$   Dysphagia   '[]'$ Weakness or numbness in arms   '[]'$ Weakness or numbness in legs Musculoskeletal:  '[x]'$ Arthritis   '[]'$ Joint swelling   '[x]'$ Joint pain   '[]'$ Low back pain Hematologic:  '[]'$ Easy bruising  '[]'$ Easy bleeding   '[]'$ Hypercoagulable state   '[]'$ Anemic   Gastrointestinal:  '[]'$ Blood in stool   '[]'$ Vomiting blood  '[]'$ Gastroesophageal reflux/heartburn   '[]'$ Abdominal pain Genitourinary:  '[]'$ Chronic kidney disease   '[]'$ Difficult urination  '[]'$ Frequent urination  '[]'$ Burning with urination   '[]'$ Hematuria Skin:  '[]'$ Rashes   '[]'$ Ulcers   '[]'$ Wounds Psychological:  '[]'$ History of anxiety   '[]'$  History of major depression.  Physical Examination  BP (!) 160/72 (BP Location: Right Arm)   Pulse 73   Resp 16   Wt 138 lb 9.6 oz (62.9 kg)  Gen:  WD/WN, NAD. Appears younger than stated age. Head: [place]/AT, No temporalis wasting. Ear/Nose/Throat: Hearing grossly intact, nares w/o erythema or drainage Eyes: Conjunctiva clear. Sclera  non-icteric Neck: Supple.  Trachea midline Pulmonary:  Good air movement, no use of accessory muscles.  Cardiac: irregular Vascular:  Vessel Right Left  Radial Palpable Palpable                          PT 1+ Palpable 1+ Palpable  DP 2+ Palpable 1+ Palpable   Gastrointestinal: soft, non-tender/non-distended. No guarding/reflex.  Musculoskeletal: M/S 5/5 throughout.  No deformity or atrophy. Trace RLE edema, 1+ LLE edema. Neurologic: Sensation grossly intact in extremities.  Symmetrical.  Speech is fluent.  Psychiatric: Judgment intact, Mood & affect appropriate for pt's clinical situation. Dermatologic: No rashes or ulcers noted.  No cellulitis or open wounds.      Labs No results found for this or any previous visit (from the past 2160 hour(s)).  Radiology No results found.  Assessment/Plan Type II diabetes mellitus with manifestations (HCC) blood glucose control important in reducing the progression of atherosclerotic disease. Also, involved in wound healing. On appropriate medications.   PAD (peripheral artery disease) (HCC) We performed ABIs last month which showed a right ABI of 1.0 and a left ABI of 0.7.  She is really not having much in the way of symptoms.  Given her advanced age and paucity of symptoms, no role for intervention at this level.  Continue her aspirin therapy.  Plan to recheck her in 6 months.  Contact our office with worsening problems in the interim.    Leotis Pain, MD  06/29/2022 3:24 PM    This note was created with Dragon medical transcription system.  Any errors from dictation are purely unintentional

## 2022-06-29 NOTE — Assessment & Plan Note (Signed)
We performed ABIs last month which showed a right ABI of 1.0 and a left ABI of 0.7.  She is really not having much in the way of symptoms.  Given her advanced age and paucity of symptoms, no role for intervention at this level.  Continue her aspirin therapy.  Plan to recheck her in 6 months.  Contact our office with worsening problems in the interim.

## 2022-12-22 ENCOUNTER — Other Ambulatory Visit (INDEPENDENT_AMBULATORY_CARE_PROVIDER_SITE_OTHER): Payer: Self-pay | Admitting: Vascular Surgery

## 2022-12-22 DIAGNOSIS — I739 Peripheral vascular disease, unspecified: Secondary | ICD-10-CM

## 2022-12-28 ENCOUNTER — Ambulatory Visit (INDEPENDENT_AMBULATORY_CARE_PROVIDER_SITE_OTHER): Payer: 59

## 2022-12-28 ENCOUNTER — Ambulatory Visit (INDEPENDENT_AMBULATORY_CARE_PROVIDER_SITE_OTHER): Payer: 59 | Admitting: Vascular Surgery

## 2022-12-28 ENCOUNTER — Encounter (INDEPENDENT_AMBULATORY_CARE_PROVIDER_SITE_OTHER): Payer: Self-pay | Admitting: Vascular Surgery

## 2022-12-28 VITALS — BP 187/63 | HR 77 | Resp 16 | Wt 135.6 lb

## 2022-12-28 DIAGNOSIS — I739 Peripheral vascular disease, unspecified: Secondary | ICD-10-CM

## 2022-12-28 DIAGNOSIS — E118 Type 2 diabetes mellitus with unspecified complications: Secondary | ICD-10-CM | POA: Diagnosis not present

## 2022-12-28 NOTE — Assessment & Plan Note (Signed)
blood glucose control important in reducing the progression of atherosclerotic disease. Also, involved in wound healing. On appropriate medications.  

## 2022-12-28 NOTE — Assessment & Plan Note (Signed)
Her ABIs today are 1.0 on the right and 0.9 on the left although there may be some false elevation due to medial calcification.  Duplex suggests only small vessel disease.  No worrisome symptoms.  Recheck in 6 months.  No change in medications.

## 2022-12-28 NOTE — Progress Notes (Signed)
MRN : [id number]  Breanna Mendoza is a 89 y.o. (1931/06/16) female who presents with chief complaint of  Chief Complaint  Patient presents with   Follow-up    Ultrasound follow up  .  History of Present Illness: Patient returns today in follow up of her PAD. She is doing well.  No rest pain, ulceration, or disabling claudication symptoms.  Her ABIs today are 1.0 on the right and 0.9 on the left although there may be some false elevation due to medial calcification.  Duplex suggests only small vessel disease.  Current Outpatient Medications  Medication Sig Dispense Refill   acetaminophen (TYLENOL) 325 MG tablet Take 325 mg by mouth every 4 (four) hours as needed.     Ascorbic Acid (VITAMIN C) 1000 MG tablet Take by mouth.     aspirin EC 81 MG tablet Take by mouth.     Calcium Carb-Cholecalciferol (CALCIUM CARBONATE-VITAMIN D3 PO) Take by mouth.     ferrous sulfate 325 (65 FE) MG tablet Take 325 mg by mouth daily with breakfast.     levocetirizine (XYZAL) 5 MG tablet SMARTSIG:1 Tablet(s) By Mouth Every Evening     levothyroxine (SYNTHROID) 75 MCG tablet Take 75 mcg by mouth daily.     metFORMIN (GLUCOPHAGE) 500 MG tablet Take by mouth.     Multiple Vitamins-Minerals (PRESERVISION AREDS PO) Take 2 capsules by mouth daily.     omeprazole (PRILOSEC) 20 MG capsule Take by mouth.     propranolol (INDERAL) 10 MG tablet Take 10 mg by mouth 2 (two) times daily.     predniSONE (DELTASONE) 10 MG tablet Take by mouth. (Patient not taking: Reported on 12/28/2022)     No current facility-administered medications for this visit.    Past Medical History:  Diagnosis Date   Breast cancer (HCC) 2009   left breast with rad tx.    Skin cancer     Past Surgical History:  Procedure Laterality Date   BREAST BIOPSY Left 2009   positve. Lumpectomy with rad tx.   BREAST BIOPSY Right 2004   negative     Social History   Tobacco Use   Smoking status: Former    Types: Cigarettes   Smokeless  tobacco: Never  Vaping Use   Vaping Use: Never used  Substance Use Topics   Alcohol use: Never   Drug use: Never      Family History  Problem Relation Age of Onset   Breast cancer Maternal Aunt      Allergies  Allergen Reactions   Penicillin G Rash     REVIEW OF SYSTEMS (Negative unless checked)  Constitutional: [] Weight loss  [] Fever  [] Chills Cardiac: [] Chest pain   [] Chest pressure   [] Palpitations   [] Shortness of breath when laying flat   [] Shortness of breath at rest   [] Shortness of breath with exertion. Vascular:  [] Pain in legs with walking   [] Pain in legs at rest   [] Pain in legs when laying flat   [] Claudication   [] Pain in feet when walking  [] Pain in feet at rest  [] Pain in feet when laying flat   [] History of DVT   [] Phlebitis   [] Swelling in legs   [] Varicose veins   [] Non-healing ulcers Pulmonary:   [] Uses home oxygen   [] Productive cough   [] Hemoptysis   [] Wheeze  [] COPD   [] Asthma Neurologic:  [] Dizziness  [] Blackouts   [] Seizures   [] History of stroke   [] History of TIA  [] Aphasia   []   Temporary blindness   [] Dysphagia   [] Weakness or numbness in arms   [] Weakness or numbness in legs Musculoskeletal:  [x] Arthritis   [] Joint swelling   [x] Joint pain   [] Low back pain Hematologic:  [] Easy bruising  [] Easy bleeding   [] Hypercoagulable state   [] Anemic   Gastrointestinal:  [] Blood in stool   [] Vomiting blood  [] Gastroesophageal reflux/heartburn   [] Abdominal pain Genitourinary:  [] Chronic kidney disease   [] Difficult urination  [] Frequent urination  [] Burning with urination   [] Hematuria Skin:  [] Rashes   [] Ulcers   [] Wounds Psychological:  [] History of anxiety   []  History of major depression.  Physical Examination  BP (!) 187/63 (BP Location: Right Arm)   Pulse 77   Resp 16   Wt 135 lb 9.6 oz (61.5 kg)  Gen:  WD/WN, NAD.  Appears younger than stated age Head: [place]/AT, No temporalis wasting. Ear/Nose/Throat: Hearing grossly intact, nares w/o erythema or  drainage Eyes: Conjunctiva clear. Sclera non-icteric Neck: Supple.  Trachea midline Pulmonary:  Good air movement, no use of accessory muscles.  Cardiac: RRR, no JVD Vascular:  Vessel Right Left  Radial Palpable Palpable                          PT Palpable Palpable  DP Palpable Palpable   Gastrointestinal: soft, non-tender/non-distended. No guarding/reflex.  Musculoskeletal: M/S 5/5 throughout.  No deformity or atrophy.  Trace lower extremity edema. Neurologic: Sensation grossly intact in extremities.  Symmetrical.  Speech is fluent.  Psychiatric: Judgment intact, Mood & affect appropriate for pt's clinical situation. Dermatologic: No rashes or ulcers noted.  No cellulitis or open wounds.     Labs No results found for this or any previous visit (from the past 2160 hour(s)).  Radiology No results found.  Assessment/Plan  Type II diabetes mellitus with manifestations (HCC) blood glucose control important in reducing the progression of atherosclerotic disease. Also, involved in wound healing. On appropriate medications.   PAD (peripheral artery disease) (HCC) Her ABIs today are 1.0 on the right and 0.9 on the left although there may be some false elevation due to medial calcification.  Duplex suggests only small vessel disease.  No worrisome symptoms.  Recheck in 6 months.  No change in medications.    Festus Barren, MD  12/28/2022 4:20 PM    This note was created with Dragon medical transcription system.  Any errors from dictation are purely unintentional

## 2022-12-29 LAB — VAS US ABI WITH/WO TBI
Left ABI: 0.91
Right ABI: 1.07

## 2023-06-23 ENCOUNTER — Ambulatory Visit (INDEPENDENT_AMBULATORY_CARE_PROVIDER_SITE_OTHER): Payer: 59 | Admitting: Nurse Practitioner

## 2023-06-23 ENCOUNTER — Ambulatory Visit (INDEPENDENT_AMBULATORY_CARE_PROVIDER_SITE_OTHER): Payer: 59

## 2023-06-23 ENCOUNTER — Encounter (INDEPENDENT_AMBULATORY_CARE_PROVIDER_SITE_OTHER): Payer: Self-pay | Admitting: Nurse Practitioner

## 2023-06-23 VITALS — BP 152/65 | HR 72 | Resp 15 | Wt 131.2 lb

## 2023-06-23 DIAGNOSIS — I739 Peripheral vascular disease, unspecified: Secondary | ICD-10-CM

## 2023-06-23 DIAGNOSIS — E118 Type 2 diabetes mellitus with unspecified complications: Secondary | ICD-10-CM | POA: Diagnosis not present

## 2023-06-25 ENCOUNTER — Encounter (INDEPENDENT_AMBULATORY_CARE_PROVIDER_SITE_OTHER): Payer: Self-pay | Admitting: Nurse Practitioner

## 2023-06-25 NOTE — Progress Notes (Signed)
Subjective:    Patient ID: Breanna Mendoza, female    DOB: [birth year], 89 y.o.   MRN: [id number] Chief Complaint  Patient presents with   Follow-up    6 month abi    The patient returns to the office for followup regarding atherosclerotic changes of the lower extremities and review of the noninvasive studies.   There have been no interval changes in lower extremity symptoms. No interval shortening of the patient's claudication distance or development of rest pain symptoms. No new ulcers or wounds have occurred since the last visit.  There have been no significant changes to the patient's overall health care.  The patient denies amaurosis fugax or recent TIA symptoms. There are no documented recent neurological changes noted. There is no history of DVT, PE or superficial thrombophlebitis. The patient denies recent episodes of angina or shortness of breath.   ABI Rt=1.06 and Lt=1.01  (previous ABI's Rt=1.07 and Lt=0.91) Duplex ultrasound of the bilateral tibial vessels shows multiphasic waveforms with good toe waveforms    Review of Systems  Cardiovascular:  Negative for leg swelling.  Skin:  Negative for wound.  All other systems reviewed and are negative.      Objective:   Physical Exam Vitals reviewed.  HENT:     Head: Normocephalic.  Cardiovascular:     Rate and Rhythm: Normal rate.     Pulses: Normal pulses.  Pulmonary:     Effort: Pulmonary effort is normal.  Skin:    General: Skin is warm and dry.  Neurological:     Mental Status: She is alert and oriented to person, place, and time.  Psychiatric:        Mood and Affect: Mood normal.        Behavior: Behavior normal.        Thought Content: Thought content normal.        Judgment: Judgment normal.     BP (!) 152/65 (BP Location: Left Arm)   Pulse 72   Resp 15   Wt 131 lb 3.2 oz (59.5 kg)   Past Medical History:  Diagnosis Date   Breast cancer (HCC) 2009   left breast with rad tx.    Skin cancer      Social History   Socioeconomic History   Marital status: Single    Spouse name: Not on file   Number of children: Not on file   Years of education: Not on file   Highest education level: Not on file  Occupational History   Not on file  Tobacco Use   Smoking status: Former    Types: Cigarettes   Smokeless tobacco: Never  Vaping Use   Vaping status: Never Used  Substance and Sexual Activity   Alcohol use: Never   Drug use: Never   Sexual activity: Not on file  Other Topics Concern   Not on file  Social History Narrative   Not on file   Social Determinants of Health   Financial Resource Strain: Not on file  Food Insecurity: Not on file  Transportation Needs: Not on file  Physical Activity: Not on file  Stress: Not on file  Social Connections: Not on file  Intimate Partner Violence: Not on file    Past Surgical History:  Procedure Laterality Date   BREAST BIOPSY Left 2009   positve. Lumpectomy with rad tx.   BREAST BIOPSY Right 2004   negative    Family History  Problem Relation Age of Onset   Breast  cancer Maternal Aunt     Allergies  Allergen Reactions   Penicillin G Rash       Latest Ref Rng & Units 10/31/2012   10:50 AM 02/23/2012   10:13 AM 08/25/2011   10:36 AM  CBC  WBC 3.6 - 11.0 x10 3/mm 7.0  8.3  8.9   Hemoglobin 12.0 - 16.0 g/dL 40.3  47.4  9.2   Hematocrit 35.0 - 47.0 % 36.6  36.5  28.1   Platelets 150 - 440 x10 3/mm 292  266  434       CMP     Component Value Date/Time   NA 139 10/31/2012 1050   K 4.4 10/31/2012 1050   CL 101 10/31/2012 1050   CO2 27 10/31/2012 1050   GLUCOSE 141 (H) 10/31/2012 1050   BUN 16 10/31/2012 1050   CREATININE 1.10 10/31/2012 1050   CALCIUM 9.3 10/31/2012 1050   PROT 7.9 10/31/2012 1050   ALBUMIN 3.7 10/31/2012 1050   AST 16 10/31/2012 1050   ALT 21 10/31/2012 1050   ALKPHOS 47 (L) 10/31/2012 1050   BILITOT 0.2 10/31/2012 1050   GFRNONAA 48 (L) 10/31/2012 1050     No results found.      Assessment & Plan:   1. PAD (peripheral artery disease) (HCC) Today the patient studies are improved from her most recent visit.  Her studies have been relatively stable and she does not have any significant symptoms from peripheral arterial disease such as claudication, rest pain or ulceration.  Due to this and the patient's preference we will not have her return in 6 months but rather in 2 years instead.  However should things change such as the development nonhealing ulcerations or claudication-like symptoms we will be happy to see the patient back.  2. Type II diabetes mellitus with manifestations (HCC) Continue hypoglycemic medications as already ordered, these medications have been reviewed and there are no changes at this time.  Hgb A1C to be monitored as already arranged by primary service   Current Outpatient Medications on File Prior to Visit  Medication Sig Dispense Refill   acetaminophen (TYLENOL) 325 MG tablet Take 325 mg by mouth every 4 (four) hours as needed.     Ascorbic Acid (VITAMIN C) 1000 MG tablet Take by mouth.     aspirin EC 81 MG tablet Take by mouth.     Calcium Carb-Cholecalciferol (CALCIUM CARBONATE-VITAMIN D3 PO) Take by mouth.     ferrous sulfate 325 (65 FE) MG tablet Take 325 mg by mouth daily with breakfast.     levocetirizine (XYZAL) 5 MG tablet SMARTSIG:1 Tablet(s) By Mouth Every Evening     levothyroxine (SYNTHROID) 75 MCG tablet Take 75 mcg by mouth daily.     metFORMIN (GLUCOPHAGE) 500 MG tablet Take by mouth.     Multiple Vitamins-Minerals (PRESERVISION AREDS PO) Take 2 capsules by mouth daily.     omeprazole (PRILOSEC) 20 MG capsule Take by mouth.     propranolol (INDERAL) 10 MG tablet Take 10 mg by mouth 2 (two) times daily.     predniSONE (DELTASONE) 10 MG tablet Take by mouth. (Patient not taking: Reported on 12/28/2022)     No current facility-administered medications on file prior to visit.    There are no Patient Instructions on file for this  visit. No follow-ups on file.   Georgiana Spinner, NP

## 2023-06-28 LAB — VAS US ABI WITH/WO TBI
Left ABI: 1.01
Right ABI: 1.06

## 2024-01-01 ENCOUNTER — Emergency Department

## 2024-01-01 ENCOUNTER — Emergency Department
Admission: EM | Admit: 2024-01-01 | Discharge: 2024-01-01 | Disposition: A | Discharge status: Home / Self Care | Attending: Emergency Medicine | Admitting: Emergency Medicine

## 2024-01-01 ENCOUNTER — Other Ambulatory Visit: Payer: Self-pay

## 2024-01-01 DIAGNOSIS — S0990XA Unspecified injury of head, initial encounter: Secondary | ICD-10-CM

## 2024-01-01 DIAGNOSIS — S0003XA Contusion of scalp, initial encounter: Secondary | ICD-10-CM | POA: Diagnosis not present

## 2024-01-01 DIAGNOSIS — W19XXXA Unspecified fall, initial encounter: Secondary | ICD-10-CM

## 2024-01-01 DIAGNOSIS — W130XXA Fall from, out of or through balcony, initial encounter: Secondary | ICD-10-CM | POA: Diagnosis not present

## 2024-01-01 DIAGNOSIS — E039 Hypothyroidism, unspecified: Secondary | ICD-10-CM | POA: Diagnosis not present

## 2024-01-01 DIAGNOSIS — R42 Dizziness and giddiness: Secondary | ICD-10-CM | POA: Diagnosis present

## 2024-01-01 DIAGNOSIS — E1122 Type 2 diabetes mellitus with diabetic chronic kidney disease: Secondary | ICD-10-CM | POA: Insufficient documentation

## 2024-01-01 DIAGNOSIS — N189 Chronic kidney disease, unspecified: Secondary | ICD-10-CM | POA: Insufficient documentation

## 2024-01-01 LAB — URINALYSIS, ROUTINE W REFLEX MICROSCOPIC
Bilirubin Urine: NEGATIVE
Glucose, UA: NEGATIVE mg/dL
Hgb urine dipstick: NEGATIVE
Ketones, ur: NEGATIVE mg/dL
Leukocytes,Ua: NEGATIVE
Nitrite: NEGATIVE
Protein, ur: NEGATIVE mg/dL
Specific Gravity, Urine: 1.008 (ref 1.005–1.030)
pH: 5 (ref 5.0–8.0)

## 2024-01-01 LAB — CBC WITH DIFFERENTIAL/PLATELET
Abs Immature Granulocytes: 0.04 10*3/uL (ref 0.00–0.07)
Basophils Absolute: 0.1 10*3/uL (ref 0.0–0.1)
Basophils Relative: 1 %
Eosinophils Absolute: 0.3 10*3/uL (ref 0.0–0.5)
Eosinophils Relative: 4 %
HCT: 35.8 % — ABNORMAL LOW (ref 36.0–46.0)
Hemoglobin: 12.2 g/dL (ref 12.0–15.0)
Immature Granulocytes: 1 %
Lymphocytes Relative: 14 %
Lymphs Abs: 1.2 10*3/uL (ref 0.7–4.0)
MCH: 33.5 pg (ref 26.0–34.0)
MCHC: 34.1 g/dL (ref 30.0–36.0)
MCV: 98.4 fL (ref 80.0–100.0)
Monocytes Absolute: 0.8 10*3/uL (ref 0.1–1.0)
Monocytes Relative: 9 %
Neutro Abs: 6.2 10*3/uL (ref 1.7–7.7)
Neutrophils Relative %: 71 %
Platelets: 245 10*3/uL (ref 150–400)
RBC: 3.64 MIL/uL — ABNORMAL LOW (ref 3.87–5.11)
RDW: 12.5 % (ref 11.5–15.5)
WBC: 8.7 10*3/uL (ref 4.0–10.5)
nRBC: 0 % (ref 0.0–0.2)

## 2024-01-01 LAB — BASIC METABOLIC PANEL WITH GFR
Anion gap: 10 (ref 5–15)
BUN: 16 mg/dL (ref 8–23)
CO2: 27 mmol/L (ref 22–32)
Calcium: 9.9 mg/dL (ref 8.9–10.3)
Chloride: 101 mmol/L (ref 98–111)
Creatinine, Ser: 0.73 mg/dL (ref 0.44–1.00)
GFR, Estimated: >60 mL/min (ref >60)
Glucose, Bld: 108 mg/dL — ABNORMAL HIGH (ref 70–99)
Potassium: 4.2 mmol/L (ref 3.5–5.1)
Sodium: 138 mmol/L (ref 135–145)

## 2024-01-01 MED ORDER — ACETAMINOPHEN 500 MG PO TABS
1000.0000 mg | ORAL_TABLET | Freq: Once | ORAL | Status: AC
  Administered 2024-01-01: 1000 mg via ORAL
  Filled 2024-01-01: qty 2

## 2024-01-01 NOTE — ED Notes (Signed)
 Helped pt up to toilet for second time. Pt able to walk by self once OOB. Pt urinates small amount each time. Pt walks independently at home.

## 2024-01-01 NOTE — ED Triage Notes (Signed)
 pt in via [facility] from home for a fall. Pt states that she got dizzy and fell on her porch. Endorses htting her head but did not pass out. Does not take blood thinners. Hypertensive with EMS 200/80.

## 2024-01-01 NOTE — ED Notes (Signed)
 Pt waiting to talk to MD before discharge

## 2024-01-01 NOTE — ED Provider Notes (Signed)
 [facility] Provider Note    Event Date/Time   First MD Initiated Contact with Patient 01/01/24 1406     (approximate)   History   Fall   HPI  Breanna Mendoza is a 89 y.o. female who presents to the ED for evaluation of Fall   I review a PCP visit from 2 months ago.  DM, hypothyroidism, CKD, GERD.   Patient presents to the ED after a fall and dizzy spell that occurred while she was outside sweeping her porch.  No preceding illnesses, was sweeping, bent over to get something when she felt lightheaded dizziness and "kept going" and knocked her head on a flowerpot.  No syncope.  She was able to get herself upright, back to the kitchen and started cooking.  When she told her daughter about this, 911 was called and she presents to the ED.  Patient reports feeling fine and has no complaints.  Reports that she was not going to get checked out but her daughter wanted her evaluated.   Physical Exam   Triage Vital Signs: ED Triage Vitals  Encounter Vitals Group     BP --      Systolic BP Percentile --      Diastolic BP Percentile --      Pulse --      Resp --      Temp --      Temp src --      SpO2 --      Weight 01/01/24 1403 134 lb 12.8 oz (61.1 kg)     Height 01/01/24 1403 4\' 10"  (1.473 m)     Head Circumference --      Peak Flow --      Pain Score 01/01/24 1401 0     Pain Loc --      Pain Education --      Exclude from Growth Chart --     Most recent vital signs: There were no vitals filed for this visit.  General: Awake, no distress.  CV:  Good peripheral perfusion.  Resp:  Normal effort.  Abd:  No distention.  MSK:  No deformity noted.  Palpation of all 4 extremities, the back without evidence of deformity, tenderness or trauma Neuro:  No focal deficits appreciated. Other:     ED Results / Procedures / Treatments   Labs (all labs ordered are listed, but only abnormal results are displayed) Labs Reviewed - No data to  display  EKG Sinus rhythm with a rate of 72 bpm.  Normal axis and intervals without clear signs of acute ischemia.  RADIOLOGY ***  Official radiology report(s): No results found.  PROCEDURES and INTERVENTIONS:  Procedures  Medications - No data to display   IMPRESSION / MDM / ASSESSMENT AND PLAN / ED COURSE  I reviewed the triage vital signs and the nursing notes.  Differential diagnosis includes, but is not limited to, skull fracture, ICH, TIA, concussion, MSK pain  {Patient presents with symptoms of an acute illness or injury that is potentially life-threatening.  Well-appearing 89 year old presents from home after a brief presyncopal dizzy spell and falling while sweeping her deck outside.  Looks well to me and asymptomatic without evidence of trauma.  No neurologic deficits.  Will obtain a CT scan of the head and neck, screen basic blood work and provide Tylenol.  Suspect she will be suitable for outpatient management.      FINAL CLINICAL IMPRESSION(S) / ED DIAGNOSES   Final  diagnoses:  None     Rx / DC Orders   ED Discharge Orders     None        Note:  This document was prepared using Dragon voice recognition software and may include unintentional dictation errors.

## 2024-01-01 NOTE — Discharge Instructions (Signed)
 Use Tylenol for pain and fevers.  Up to 1000 mg per dose, up to 4 times per day.  Do not take more than 4000 mg of Tylenol/acetaminophen within 24 hours..  Be careful sweeping!

## 2024-05-24 ENCOUNTER — Emergency Department
Admission: EM | Admit: 2024-05-24 | Discharge: 2024-05-24 | Disposition: A | Discharge status: Home / Self Care | Attending: Emergency Medicine | Admitting: Emergency Medicine

## 2024-05-24 ENCOUNTER — Other Ambulatory Visit: Payer: Self-pay

## 2024-05-24 ENCOUNTER — Emergency Department

## 2024-05-24 DIAGNOSIS — M7989 Other specified soft tissue disorders: Secondary | ICD-10-CM | POA: Insufficient documentation

## 2024-05-24 LAB — CBC
HCT: 34.9 % — ABNORMAL LOW (ref 36.0–46.0)
Hemoglobin: 11.4 g/dL — ABNORMAL LOW (ref 12.0–15.0)
MCH: 32.8 pg (ref 26.0–34.0)
MCHC: 32.7 g/dL (ref 30.0–36.0)
MCV: 100.3 fL — ABNORMAL HIGH (ref 80.0–100.0)
Platelets: 243 K/uL (ref 150–400)
RBC: 3.48 MIL/uL — ABNORMAL LOW (ref 3.87–5.11)
RDW: 12.2 % (ref 11.5–15.5)
WBC: 7.8 K/uL (ref 4.0–10.5)
nRBC: 0 % (ref 0.0–0.2)

## 2024-05-24 LAB — BASIC METABOLIC PANEL WITH GFR
Anion gap: 11 (ref 5–15)
BUN: 25 mg/dL — ABNORMAL HIGH (ref 8–23)
CO2: 24 mmol/L (ref 22–32)
Calcium: 9.8 mg/dL (ref 8.9–10.3)
Chloride: 105 mmol/L (ref 98–111)
Creatinine, Ser: 0.8 mg/dL (ref 0.44–1.00)
GFR, Estimated: >60 mL/min (ref >60)
Glucose, Bld: 102 mg/dL — ABNORMAL HIGH (ref 70–99)
Potassium: 4.6 mmol/L (ref 3.5–5.1)
Sodium: 140 mmol/L (ref 135–145)

## 2024-05-24 MED ORDER — CEPHALEXIN 500 MG PO CAPS
500.0000 mg | ORAL_CAPSULE | Freq: Once | ORAL | Status: AC
  Administered 2024-05-24: 500 mg via ORAL
  Filled 2024-05-24: qty 1

## 2024-05-24 MED ORDER — CEPHALEXIN 500 MG PO CAPS: 500.0000 mg | ORAL_CAPSULE | Freq: Two times a day (BID) | ORAL | 0 refills | Status: AC

## 2024-05-24 NOTE — ED Notes (Signed)
 See triage note   Presents with some leg swelling  Swelling noted to left leg some redness

## 2024-05-24 NOTE — ED Triage Notes (Signed)
 Pt reports left leg swelling, redness and warmth since Sunday. Pain while weight bearing and cramping. No blood thinners. No hx of DVT.

## 2024-05-24 NOTE — ED Provider Notes (Signed)
 [facility] Emergency Department Provider Note     Event Date/Time   First MD Initiated Contact with Patient 05/24/24 1821     (approximate)   History   Leg Swelling   HPI  Breanna Mendoza is a 89 y.o. female with no significant past medical history presents to the ED for evaluation of left lower leg swelling, redness and warmth since 3-4 days ago.  Reports intermittent pain only with weightbearing.  She denies injury, fevers, numbness or weakness.      Physical Exam   Triage Vital Signs: ED Triage Vitals  Encounter Vitals Group     BP 05/24/24 1654 (!) 156/62     Girls Systolic BP Percentile --      Girls Diastolic BP Percentile --      Boys Systolic BP Percentile --      Boys Diastolic BP Percentile --      Pulse Rate 05/24/24 1654 76     Resp 05/24/24 1654 17     Temp 05/24/24 1654 98.3 F (36.8 C)     Temp Source 05/24/24 1654 Oral     SpO2 05/24/24 1654 100 %     Weight 05/24/24 1830 134 lb 11.2 oz (61.1 kg)     Height 05/24/24 1830 4' 10 (1.473 m)     Head Circumference --      Peak Flow --      Pain Score --      Pain Loc --      Pain Education --      Exclude from Growth Chart --     Most recent vital signs: Vitals:   05/24/24 1654  BP: (!) 156/62  Pulse: 76  Resp: 17  Temp: 98.3 F (36.8 C)  SpO2: 100%    General Awake, no distress.  HEENT NCAT.  CV:  Good peripheral perfusion.  RESP:  Normal effort.  ABD:  No distention.  Other:  Left lower extremity over anterior shin and calf reveals erythema, warmth to touch and notable swelling compared to right leg.  Nontender to palpation.  Sensation intact.  Pedal pulses palpated.   ED Results / Procedures / Treatments   Labs (all labs ordered are listed, but only abnormal results are displayed) Labs Reviewed  CBC - Abnormal; Notable for the following components:      Result Value   RBC 3.48 (*)    Hemoglobin 11.4 (*)    HCT 34.9 (*)    MCV 100.3 (*)    All other  components within normal limits  BASIC METABOLIC PANEL WITH GFR - Abnormal; Notable for the following components:   Glucose, Bld 102 (*)    BUN 25 (*)    All other components within normal limits   RADIOLOGY  I personally viewed and evaluated these images as part of my medical decision making, as well as reviewing the written report by the radiologist.  ED Provider Interpretation: Negative DVT  US  Venous Img Lower Unilateral Left Result Date: 05/24/2024 CLINICAL DATA:  Leg swelling with pain EXAM: Left LOWER EXTREMITY VENOUS DOPPLER ULTRASOUND TECHNIQUE: Gray-scale sonography with compression, as well as color and duplex ultrasound, were performed to evaluate the deep venous system(s) from the level of the common femoral vein through the popliteal and proximal calf veins. COMPARISON:  None Available. FINDINGS: VENOUS Normal compressibility of the common femoral, superficial femoral, and popliteal veins, as well as the visualized calf veins. Visualized portions of profunda femoral vein and great  saphenous vein unremarkable. No filling defects to suggest DVT on grayscale or color Doppler imaging. Doppler waveforms show normal direction of venous flow, normal respiratory plasticity and response to augmentation. Limited views of the contralateral common femoral vein are unremarkable. OTHER Edema within the subcutaneous soft tissues. Limitations: none IMPRESSION: Negative. Electronically Signed   By: Luke Bun M.D.   On: 05/24/2024 18:21    PROCEDURES:  Critical Care performed: No  Procedures   MEDICATIONS ORDERED IN ED: Medications  cephALEXin (KEFLEX) capsule 500 mg (500 mg Oral Given 05/24/24 2039)     IMPRESSION / MDM / ASSESSMENT AND PLAN / ED COURSE  I reviewed the triage vital signs and the nursing notes.                               89 y.o. female presents to the emergency department for evaluation and treatment of left lower leg swelling. See HPI for further details.    Differential diagnosis includes, but is not limited to DVT, cellulitis, erysipelas  Patient's presentation is most consistent with acute complicated illness / injury requiring diagnostic workup.  Patient is alert and oriented.  She is hemodynamic stable.  Physical exam findings are stated above.  Ultrasound ruled out DVT.  High suspicion for cellulitis given the erythema, swelling and warmth.  Patient denies systemic symptoms.  She denies any skin injuries to the area.  Lab work is reassuring.  No leukocytosis.  Plan to treat with antibiotics.  Advised close follow-up with primary care provider for further evaluation.  Patient is in stable condition for discharge home.  ED return precaution discussed.  FINAL CLINICAL IMPRESSION(S) / ED DIAGNOSES   Final diagnoses:  Left leg swelling   Rx / DC Orders   ED Discharge Orders          Ordered    cephALEXin (KEFLEX) 500 MG capsule  2 times daily        05/24/24 2023             Note:  This document was prepared using Dragon voice recognition software and may include unintentional dictation errors.    Margrette, Estee Yohe A, PA-C 05/24/24 2147    Waymond Lorelle Cummins, MD 05/25/24 [phone]

## 2024-05-24 NOTE — Discharge Instructions (Signed)
 Your evaluated in the ED for left leg swelling.  Your ultrasound is negative.  Your lab work is reassuring.  We will start on a short course of antibiotics.  Please follow-up with your primary care provider.  Elevate the affected leg.  Closely monitor for spreading of redness.

## 2024-05-24 NOTE — Progress Notes (Signed)
 SENT TO ED . CONCERN FOR DVT
